# Patient Record
Sex: Male | Born: 1955 | Race: White | Hispanic: No | Marital: Married | Smoking: Never smoker
Health system: Southern US, Community
[De-identification: ages and names within clinical notes are randomized; demographics above are authoritative.]

## PROBLEM LIST (undated history)

## (undated) DIAGNOSIS — M109 Gout, unspecified: Secondary | ICD-10-CM

## (undated) DIAGNOSIS — C801 Malignant (primary) neoplasm, unspecified: Secondary | ICD-10-CM

## (undated) DIAGNOSIS — D039 Melanoma in situ, unspecified: Secondary | ICD-10-CM

## (undated) HISTORY — PX: OTHER SURGICAL HISTORY: SHX169

## (undated) HISTORY — PX: KNEE SURGERY: SHX244

---
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
## 2017-07-02 DIAGNOSIS — D039 Melanoma in situ, unspecified: Secondary | ICD-10-CM

## 2017-07-02 HISTORY — DX: Melanoma in situ, unspecified: D03.9

## 2018-12-14 ENCOUNTER — Other Ambulatory Visit: Payer: Self-pay

## 2018-12-14 ENCOUNTER — Emergency Department (HOSPITAL_BASED_OUTPATIENT_CLINIC_OR_DEPARTMENT_OTHER)
Admission: EM | Admit: 2018-12-14 | Discharge: 2018-12-14 | Disposition: A | Discharge status: Home / Self Care | Payer: 59 | Attending: Emergency Medicine | Admitting: Emergency Medicine

## 2018-12-14 ENCOUNTER — Encounter (HOSPITAL_BASED_OUTPATIENT_CLINIC_OR_DEPARTMENT_OTHER): Payer: Self-pay | Admitting: Emergency Medicine

## 2018-12-14 ENCOUNTER — Emergency Department (HOSPITAL_BASED_OUTPATIENT_CLINIC_OR_DEPARTMENT_OTHER): Payer: 59

## 2018-12-14 DIAGNOSIS — Y929 Unspecified place or not applicable: Secondary | ICD-10-CM | POA: Insufficient documentation

## 2018-12-14 DIAGNOSIS — Y999 Unspecified external cause status: Secondary | ICD-10-CM | POA: Insufficient documentation

## 2018-12-14 DIAGNOSIS — W450XXA Nail entering through skin, initial encounter: Secondary | ICD-10-CM | POA: Diagnosis not present

## 2018-12-14 DIAGNOSIS — Z79899 Other long term (current) drug therapy: Secondary | ICD-10-CM | POA: Diagnosis not present

## 2018-12-14 DIAGNOSIS — Y9301 Activity, walking, marching and hiking: Secondary | ICD-10-CM | POA: Diagnosis not present

## 2018-12-14 DIAGNOSIS — Z23 Encounter for immunization: Secondary | ICD-10-CM | POA: Insufficient documentation

## 2018-12-14 DIAGNOSIS — S91331A Puncture wound without foreign body, right foot, initial encounter: Secondary | ICD-10-CM | POA: Diagnosis present

## 2018-12-14 HISTORY — DX: Malignant (primary) neoplasm, unspecified: C80.1

## 2018-12-14 HISTORY — DX: Gout, unspecified: M10.9

## 2018-12-14 MED ORDER — TETANUS-DIPHTH-ACELL PERTUSSIS 5-2.5-18.5 LF-MCG/0.5 IM SUSP
0.5000 mL | Freq: Once | INTRAMUSCULAR | Status: AC
  Administered 2018-12-14: 0.5 mL via INTRAMUSCULAR
  Filled 2018-12-14: qty 0.5

## 2018-12-14 MED ORDER — CIPROFLOXACIN HCL 500 MG PO TABS: 500.0000 mg | ORAL_TABLET | Freq: Two times a day (BID) | ORAL | 0 refills | Status: AC

## 2018-12-14 NOTE — ED Provider Notes (Signed)
[facility] EMERGENCY DEPARTMENT Provider Note   CSN: [id number] Arrival date & time: 12/14/18  1629     History   Chief Complaint Chief Complaint  Patient presents with  . Puncture Wound    HPI Austin Powell. is a 63 y.o. male who presents for evaluation of puncture wound to right foot.  He reports that about an hour prior to ED arrival, he was working on the woods and he states that he stepped on a board that had a rusty needle on it.  He states that he was wearing [employer]land boots at the time but states that the needle went straight through the sole of the boot.  He states he has been able to walk and move the foot since then.  He states he does not know when his last tetanus shot was.  He denies any numbness/weakness.  The history is provided by the patient.    Past Medical History:  Diagnosis Date  . Cancer ([place])    skin cancer removal  . Gout     There are no active problems to display for this patient.   Past Surgical History:  Procedure Laterality Date  . gout    . KNEE SURGERY          Home Medications    Prior to Admission medications   Medication Sig Start Date End Date Taking? Authorizing Provider  allopurinol (ZYLOPRIM) 100 MG tablet Take 100 mg by mouth daily.   Yes [provider]  ciprofloxacin (CIPRO) 500 MG tablet Take 1 tablet (500 mg total) by mouth every 12 (twelve) hours for 7 days. 12/14/18 12/21/18  Volanda Napoleon, PA-C    Family History History reviewed. No pertinent family history.  Social History Social History   Tobacco Use  . Smoking status: Never Smoker  . Smokeless tobacco: Never Used  Substance Use Topics  . Alcohol use: Yes  . Drug use: Never     Allergies   Latex   Review of Systems Review of Systems  Skin: Positive for wound.  Neurological: Negative for weakness and numbness.  All other systems reviewed and are negative.    Physical Exam Updated Vital Signs BP (!) 144/100 (BP  Location: Right Arm)   Pulse (!) 52   Temp 97.7 F (36.5 C) (Oral)   Resp 18   Ht 5\' 10"  (1.778 m)   Wt 83.9 kg   SpO2 98%   BMI 26.54 kg/m   Physical Exam Vitals signs and nursing note reviewed.  Constitutional:      Appearance: He is well-developed.  HENT:     Head: Normocephalic and atraumatic.  Eyes:     General: No scleral icterus.       Right eye: No discharge.        Left eye: No discharge.     Conjunctiva/sclera: Conjunctivae normal.  Cardiovascular:     Pulses:          Dorsalis pedis pulses are 2+ on the right side and 2+ on the left side.  Pulmonary:     Effort: Pulmonary effort is normal.  Skin:    General: Skin is warm and dry.     Capillary Refill: Capillary refill takes less than 2 seconds.     Comments: Small 0.25 cm wound noted to the plantar surface of the foot. Good distal cap refill.  RLE is not dusky in appearance or cool to touch.  Neurological:     Mental Status: He is  alert.  Psychiatric:        Speech: Speech normal.        Behavior: Behavior normal.      ED Treatments / Results  Labs (all labs ordered are listed, but only abnormal results are displayed) Labs Reviewed - No data to display  EKG None  Radiology Dg Foot Complete Right  Result Date: 12/14/2018 CLINICAL DATA:  Stepped on a screw 1 hour ago, puncture wound to plantar aspect of RIGHT foot EXAM: RIGHT FOOT COMPLETE - 3+ VIEW COMPARISON:  None FINDINGS: Osseous mineralization normal. Joint spaces preserved. Site of puncture not specified, question at midfoot on lateral view where minimal plantar soft tissue swelling is identified at the level of the TMT joints. No acute fracture, dislocation, or bone destruction. No soft tissue gas or radiopaque foreign bodies. IMPRESSION: No acute osseous abnormalities or radiopaque foreign bodies. Electronically Signed   By: Lavonia Dana M.D.   On: 12/14/2018 17:18    Procedures Procedures (including critical care time)  Medications Ordered in  ED Medications  Tdap (BOOSTRIX) injection 0.5 mL (0.5 mLs Intramuscular Given 12/14/18 1652)     Initial Impression / Assessment and Plan / ED Course  I have reviewed the triage vital signs and the nursing notes.  Pertinent labs & imaging results that were available during my care of the patient were reviewed by me and considered in my medical decision making (see chart for details).     63 year old male who presents for evaluation of puncture wound right foot.  Does not know when his last tetanus shot was. Patient is afebrile, non-toxic appearing, sitting comfortably on examination table. Vital signs reviewed and stable. Patient is neurovascularly intact.  Plan for wound care, update tetanus, x-ray.  X-ray negative for any acute bony abnormalities.  Discussed results with patient.  Encouraged at home supportive care.  Given that it was through his shoe and a nail injury, will plan to put him on antibiotics for Pseudomonas coverage.  Patient with no known drug allergies. At this time, patient exhibits no emergent life-threatening condition that require further evaluation in ED or admission. Patient had ample opportunity for questions and discussion. All patient's questions were answered with full understanding. Strict return precautions discussed. Patient expresses understanding and agreement to plan.   Portions of this note were generated with Lobby[employer]. Dictation errors may occur despite best attempts at proofreading.   Final Clinical Impressions(s) / ED Diagnoses   Final diagnoses:  Puncture wound of right foot, initial encounter    ED Discharge Orders         Ordered    ciprofloxacin (CIPRO) 500 MG tablet  Every 12 hours     12/14/18 1742           Volanda Napoleon, PA-C 12/14/18 [place], [place], DO 12/14/18 Bosie Helper

## 2018-12-14 NOTE — Discharge Instructions (Signed)
Keep the wound clean and dry and gently clean the wound with soap and water. Make sure to pat dry the wound before covering it with any dressing. You can use topical antibiotic ointment and bandage. Ice and elevate for pain relief.   You can take Tylenol or Ibuprofen as directed for pain. You can alternate Tylenol and Ibuprofen every 4 hours for additional pain relief.   Take antibiotics as directed. Please take all of your antibiotics until finished.  Monitor closely for any signs of infection. Return to the Emergency Department for any worsening redness/swelling of the area that begins to spread, drainage from the site, worsening pain, fever or any other worsening or concerning symptoms.

## 2018-12-14 NOTE — ED Triage Notes (Signed)
Patient states that he stepped on a rusty screw about an hour ago to the right foot.

## 2018-12-31 ENCOUNTER — Other Ambulatory Visit: Payer: Self-pay | Admitting: Gastroenterology

## 2018-12-31 DIAGNOSIS — K921 Melena: Secondary | ICD-10-CM

## 2018-12-31 DIAGNOSIS — R140 Abdominal distension (gaseous): Secondary | ICD-10-CM

## 2018-12-31 DIAGNOSIS — R109 Unspecified abdominal pain: Secondary | ICD-10-CM

## 2019-01-13 ENCOUNTER — Ambulatory Visit
Admission: RE | Admit: 2019-01-13 | Discharge: 2019-01-13 | Disposition: A | Discharge status: Home / Self Care | Payer: 59 | Source: Ambulatory Visit | Attending: Gastroenterology | Admitting: Gastroenterology

## 2019-01-13 DIAGNOSIS — R109 Unspecified abdominal pain: Secondary | ICD-10-CM

## 2019-01-13 DIAGNOSIS — R140 Abdominal distension (gaseous): Secondary | ICD-10-CM

## 2019-01-13 DIAGNOSIS — K921 Melena: Secondary | ICD-10-CM

## 2019-01-13 MED ORDER — IOPAMIDOL (ISOVUE-300) INJECTION 61%
100.0000 mL | Freq: Once | INTRAVENOUS | Status: AC | PRN
  Administered 2019-01-13: 100 mL via INTRAVENOUS

## 2019-05-22 ENCOUNTER — Encounter: Payer: Self-pay | Admitting: *Deleted

## 2020-07-05 ENCOUNTER — Other Ambulatory Visit: Payer: Self-pay

## 2020-07-05 ENCOUNTER — Ambulatory Visit (INDEPENDENT_AMBULATORY_CARE_PROVIDER_SITE_OTHER): Payer: 59 | Admitting: Physician Assistant

## 2020-07-05 ENCOUNTER — Encounter: Payer: Self-pay | Admitting: Physician Assistant

## 2020-07-05 DIAGNOSIS — L219 Seborrheic dermatitis, unspecified: Secondary | ICD-10-CM | POA: Diagnosis not present

## 2020-07-05 DIAGNOSIS — L570 Actinic keratosis: Secondary | ICD-10-CM | POA: Diagnosis not present

## 2020-07-05 DIAGNOSIS — Z86006 Personal history of melanoma in-situ: Secondary | ICD-10-CM | POA: Diagnosis not present

## 2020-07-05 MED ORDER — CICLOPIROX OLAMINE 0.77 % EX CREA: TOPICAL_CREAM | Freq: Two times a day (BID) | CUTANEOUS | 2 refills | Status: DC

## 2020-07-05 MED ORDER — HYDROCORTISONE 2.5 % EX CREA: TOPICAL_CREAM | Freq: Two times a day (BID) | CUTANEOUS | 3 refills | Status: AC | PRN

## 2020-07-05 MED ORDER — CICLOPIROX 1 % EX SHAM: 1.0000 "application " | MEDICATED_SHAMPOO | Freq: Every day | CUTANEOUS | 6 refills | Status: DC

## 2020-07-05 NOTE — Progress Notes (Signed)
   Follow up Visit  Subjective  Austin Powell. is a 64 y.o. male who presents for the following: Follow-up (Needs medication refill cilopiroc shampoo and cream and Hydrocortisone 2.5% cream for patients face. ). His seb derm comes and goes and sometimes gets worse when he is hot. The rash on his legs has gotten better since he has been covering his legs when he mows.  Objective  Well appearing patient in no apparent distress; mood and affect are within normal limits.  All skin waist up examined. No suspicious moles noted on back.   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   Objective  Left Malar Cheek, Left Preauricular Area (3), Left Temporal Scalp, Mid Parietal Scalp, Right Antihelix, Right Forehead (4), Right Malar Cheek, Right Temporal Scalp (2): Erythematous patches with gritty scale.  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  Seborrheic dermatitis (2) Nose; Lips  Ordered Medications: ciclopirox (LOPROX) 0.77 % cream Ciclopirox 1 % shampoo hydrocortisone 2.5 % cream  AK (actinic keratosis) (14) Right Antihelix; Mid Parietal Scalp; Left Preauricular Area (3); Left Malar Cheek; Right Malar Cheek; Right Forehead (4); Left Temporal Scalp; Right Temporal Scalp (2)  Destruction of lesion - Left Malar Cheek, Left Preauricular Area, Left Temporal Scalp, Mid Parietal Scalp, Right Antihelix, Right Forehead, Right Malar Cheek, Right Temporal Scalp Complexity: simple   Destruction method: cryotherapy   Informed consent: discussed and consent obtained   Timeout:  patient name, date of birth, surgical site, and procedure verified Lesion destroyed using liquid nitrogen: Yes   Outcome: patient tolerated procedure well with no complications

## 2020-11-21 ENCOUNTER — Emergency Department (HOSPITAL_COMMUNITY)
Admission: EM | Admit: 2020-11-21 | Discharge: 2020-11-21 | Disposition: A | Discharge status: Home / Self Care | Payer: 59 | Attending: Emergency Medicine | Admitting: Emergency Medicine

## 2020-11-21 ENCOUNTER — Encounter (HOSPITAL_COMMUNITY): Payer: Self-pay | Admitting: Emergency Medicine

## 2020-11-21 ENCOUNTER — Emergency Department (HOSPITAL_COMMUNITY): Payer: 59

## 2020-11-21 ENCOUNTER — Other Ambulatory Visit: Payer: Self-pay

## 2020-11-21 DIAGNOSIS — R030 Elevated blood-pressure reading, without diagnosis of hypertension: Secondary | ICD-10-CM | POA: Diagnosis not present

## 2020-11-21 DIAGNOSIS — R0789 Other chest pain: Secondary | ICD-10-CM | POA: Insufficient documentation

## 2020-11-21 DIAGNOSIS — Z85828 Personal history of other malignant neoplasm of skin: Secondary | ICD-10-CM | POA: Diagnosis not present

## 2020-11-21 DIAGNOSIS — Z91040 Latex allergy status: Secondary | ICD-10-CM | POA: Diagnosis not present

## 2020-11-21 DIAGNOSIS — R079 Chest pain, unspecified: Secondary | ICD-10-CM | POA: Diagnosis present

## 2020-11-21 LAB — CBC
HCT: 45.8 % (ref 39.0–52.0)
Hemoglobin: 15 g/dL (ref 13.0–17.0)
MCH: 32.3 pg (ref 26.0–34.0)
MCHC: 32.8 g/dL (ref 30.0–36.0)
MCV: 98.7 fL (ref 80.0–100.0)
Platelets: 195 10*3/uL (ref 150–400)
RBC: 4.64 MIL/uL (ref 4.22–5.81)
RDW: 12.5 % (ref 11.5–15.5)
WBC: 7.7 10*3/uL (ref 4.0–10.5)
nRBC: 0 % (ref 0.0–0.2)

## 2020-11-21 LAB — BASIC METABOLIC PANEL
Anion gap: 9 (ref 5–15)
BUN: 17 mg/dL (ref 8–23)
CO2: 27 mmol/L (ref 22–32)
Calcium: 9.3 mg/dL (ref 8.9–10.3)
Chloride: 104 mmol/L (ref 98–111)
Creatinine, Ser: 1.18 mg/dL (ref 0.61–1.24)
GFR, Estimated: >60 mL/min (ref >60)
Glucose, Bld: 116 mg/dL — ABNORMAL HIGH (ref 70–99)
Potassium: 5.2 mmol/L — ABNORMAL HIGH (ref 3.5–5.1)
Sodium: 140 mmol/L (ref 135–145)

## 2020-11-21 LAB — TROPONIN I (HIGH SENSITIVITY)
Troponin I (High Sensitivity): 6 ng/L (ref <18)
Troponin I (High Sensitivity): 5 ng/L (ref <18)

## 2020-11-21 MED ORDER — LIDOCAINE VISCOUS HCL 2 % MT SOLN
15.0000 mL | Freq: Once | OROMUCOSAL | Status: AC
  Administered 2020-11-21: 15 mL via ORAL
  Filled 2020-11-21: qty 15

## 2020-11-21 MED ORDER — ALUM & MAG HYDROXIDE-SIMETH 200-200-20 MG/5ML PO SUSP
30.0000 mL | Freq: Once | ORAL | Status: AC
  Administered 2020-11-21: 30 mL via ORAL
  Filled 2020-11-21: qty 30

## 2020-11-21 MED ORDER — PANTOPRAZOLE SODIUM 20 MG PO TBEC: 20.0000 mg | DELAYED_RELEASE_TABLET | Freq: Every day | ORAL | 0 refills | Status: DC

## 2020-11-21 NOTE — ED Triage Notes (Signed)
Intermittent sharp chest pain and increased HR since waking up this morning.  States he feels SOB since arriving to hospital but believes it is because he is nervous.  Denies nausea and vomiting.

## 2020-11-21 NOTE — ED Notes (Signed)
Discharged

## 2020-11-21 NOTE — Discharge Instructions (Addendum)
Please avoid any alcohol or spicy foods as well as ibuprofen and Motrin as this can cause reflux type symptoms.  Take Protonix as prescribed.  Follow-up with your primary care doctor and return to the ED if symptoms worsen.

## 2020-11-21 NOTE — ED Provider Notes (Signed)
[facility] EMERGENCY DEPARTMENT Provider Note   CSN: [id number] Arrival date & time: 11/21/20  1937     History Chief Complaint  Patient presents with  . Chest Pain    Austin Powell. is a 65 y.o. male.  The history is provided by the patient.  Chest Pain Pain location:  L chest Pain quality: not aching   Pain radiates to:  Does not radiate Pain severity:  Mild Onset quality:  Gradual Chronicity:  New Relieved by:  Nothing Worsened by:  Nothing Associated symptoms: no abdominal pain, no back pain, no cough, no fever, no palpitations, no shortness of breath and no vomiting   Risk factors: high cholesterol   Risk factors: no coronary artery disease, no diabetes mellitus, no hypertension and no prior DVT/PE        Past Medical History:  Diagnosis Date  . Cancer ([place])    skin cancer removal  . Gout   . 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   Past Surgical History:  Procedure Laterality Date  . gout    . KNEE SURGERY         No family history on file.  Social History   Tobacco Use  . Smoking status: Never Smoker  . Smokeless tobacco: Never Used  Substance Use Topics  . Alcohol use: Yes  . Drug use: Never    Home Medications Prior to Admission medications   Medication Sig Start Date End Date Taking? Authorizing Provider  pantoprazole (PROTONIX) 20 MG tablet Take 1 tablet (20 mg total) by mouth daily for 14 days. 11/21/20 12/05/20 Yes Amour Cutrone, DO  allopurinol (ZYLOPRIM) 100 MG tablet Take 100 mg by mouth daily.    [provider]  aspirin 81 MG EC tablet Take by mouth.    [provider]  ciclopirox (LOPROX) 0.77 % cream Apply topically 2 (two) times daily. 07/05/20   [place], [place], PA-C  Ciclopirox 1 % shampoo Apply 1 application topically daily. 07/05/20   Clark-Burning, Anderson Malta, PA-C  hydrocortisone 2.5 % cream Apply  topically 2 (two) times daily as needed (Rash). 07/05/20   [place], Anderson Malta, PA-C    Allergies    Latex  Review of Systems   Review of Systems  Constitutional: Negative for chills and fever.  HENT: Negative for ear pain and sore throat.   Eyes: Negative for pain and visual disturbance.  Respiratory: Negative for cough and shortness of breath.   Cardiovascular: Positive for chest pain. Negative for palpitations.  Gastrointestinal: Negative for abdominal pain and vomiting.  Genitourinary: Negative for dysuria and hematuria.  Musculoskeletal: Negative for arthralgias and back pain.  Skin: Negative for color change and rash.  Neurological: Negative for seizures and syncope.  All other systems reviewed and are negative.   Physical Exam Updated Vital Signs  ED Triage Vitals  Enc Vitals Group     BP 11/21/20 1001 (!) 152/79     Pulse Rate 11/21/20 1001 (!) 47     Resp 11/21/20 1001 17     Temp 11/21/20 1001 98.3 F (36.8 C)     Temp Source 11/21/20 1001 Oral     SpO2 11/21/20 1001 100 %     Weight 11/21/20 1001 188 lb (85.3 kg)     Height 11/21/20 1001 5\' 10"  (1.778 m)     Head Circumference --      Peak Flow --  Pain Score 11/21/20 0958 3     Pain Loc --      Pain Edu? --      Excl. in [place]? --     Physical Exam Vitals and nursing note reviewed.  Constitutional:      General: He is not in acute distress.    Appearance: He is well-developed and well-nourished. He is not ill-appearing.  HENT:     Head: Normocephalic and atraumatic.  Eyes:     Extraocular Movements: Extraocular movements intact.     Conjunctiva/sclera: Conjunctivae normal.     Pupils: Pupils are equal, round, and reactive to light.  Cardiovascular:     Rate and Rhythm: Normal rate and regular rhythm.     Pulses:          Radial pulses are 2+ on the right side and 2+ on the left side.     Heart sounds: No murmur heard.   Pulmonary:     Effort: Pulmonary effort is normal. No respiratory  distress.     Breath sounds: Normal breath sounds. No decreased breath sounds.  Abdominal:     Palpations: Abdomen is soft.     Tenderness: There is no abdominal tenderness.  Musculoskeletal:        General: No edema. Normal range of motion.     Cervical back: Normal range of motion and neck supple.     Right lower leg: No edema.     Left lower leg: No edema.  Skin:    General: Skin is warm and dry.     Capillary Refill: Capillary refill takes less than 2 seconds.  Neurological:     General: No focal deficit present.     Mental Status: He is alert.  Psychiatric:        Mood and Affect: Mood and affect normal.     ED Results / Procedures / Treatments   Labs (all labs ordered are listed, but only abnormal results are displayed) Labs Reviewed  BASIC METABOLIC PANEL - Abnormal; Notable for the following components:      Result Value   Potassium 5.2 (*)    Glucose, Bld 116 (*)    All other components within normal limits  CBC  TROPONIN I (HIGH SENSITIVITY)  TROPONIN I (HIGH SENSITIVITY)    EKG EKG Interpretation  Date/Time:  Sunday November 21 2020 09:59:36 EST Ventricular Rate:  49 PR Interval:  146 QRS Duration: 94 QT Interval:  464 QTC Calculation: 419 R Axis:   71 Text Interpretation: Sinus bradycardia with sinus arrhythmia Otherwise normal ECG Confirmed by Lennice Sites [phone]) on 11/21/2020 11:08:16 AM   Radiology DG Chest 2 View  Result Date: 11/21/2020 CLINICAL DATA:  Chest pain, shortness of breath. EXAM: CHEST - 2 VIEW COMPARISON:  None. FINDINGS: The heart size and mediastinal contours are within normal limits. Both lungs are clear. No pneumothorax or pleural effusion is noted. The visualized skeletal structures are unremarkable. IMPRESSION: No active cardiopulmonary disease. Electronically Signed   By: Marijo Conception M.D.   On: 11/21/2020 10:13    Procedures Procedures (including critical care time)  Medications Ordered in ED Medications  alum & mag  hydroxide-simeth (MAALOX/MYLANTA) 200-200-20 MG/5ML suspension 30 mL (30 mLs Oral Given 11/21/20 1133)    And  lidocaine (XYLOCAINE) 2 % viscous mouth solution 15 mL (15 mLs Oral Given 11/21/20 1133)    ED Course  I have reviewed the triage vital signs and the nursing notes.  Pertinent labs & imaging results  that were available during my care of the patient were reviewed by me and considered in my medical decision making (see chart for details).    MDM Rules/Calculators/A&P                          Austin Powell. is a 65 year old male with history of high cholesterol who presents to the ED with chest pain.  Normal vitals.  No fever.  EKG shows sinus bradycardia with no ischemic changes.  Atypical sounding chest pain.  Chest x-ray showed no pneumothorax, no pneumonia.  Patient with no PE risk factors and doubt PE.  Heart score is 2.  First troponin already drawn is normal.  Possibly GI related pain as pain this morning after drinking some alcohol last night.  Does not have any abdominal tenderness on exam.  Overall appears comfortable.  Clear breath sounds.  Exam is overall unremarkable.  Patient works out daily and does not have chest pain when he works out.  Will get repeat troponin and give GI cocktail anticipate discharge with follow-up with primary care doctor.  Repeat troponin is unremarkable.  Feeling better after GI cocktail.  Suspect possibly gastritis or reflux.  Will prescribe Protonix and have him follow-up with primary care doctor.  This chart was dictated using voice recognition software.  Despite best efforts to proofread,  errors can occur which can change the documentation meaning.    Final Clinical Impression(s) / ED Diagnoses Final diagnoses:  Atypical chest pain    Rx / DC Orders ED Discharge Orders         Ordered    pantoprazole (PROTONIX) 20 MG tablet  Daily        11/21/20 [place], [place], DO 11/21/20 1341

## 2021-07-06 ENCOUNTER — Ambulatory Visit: Payer: 59 | Admitting: Physician Assistant

## 2021-12-07 IMAGING — CR DG CHEST 2V
2 series · 2 of 2 positions shown · non-contrast
Comparison: None.

CLINICAL DATA: Chest pain, shortness of breath.

EXAM:
CHEST - 2 VIEW

[chest pa]
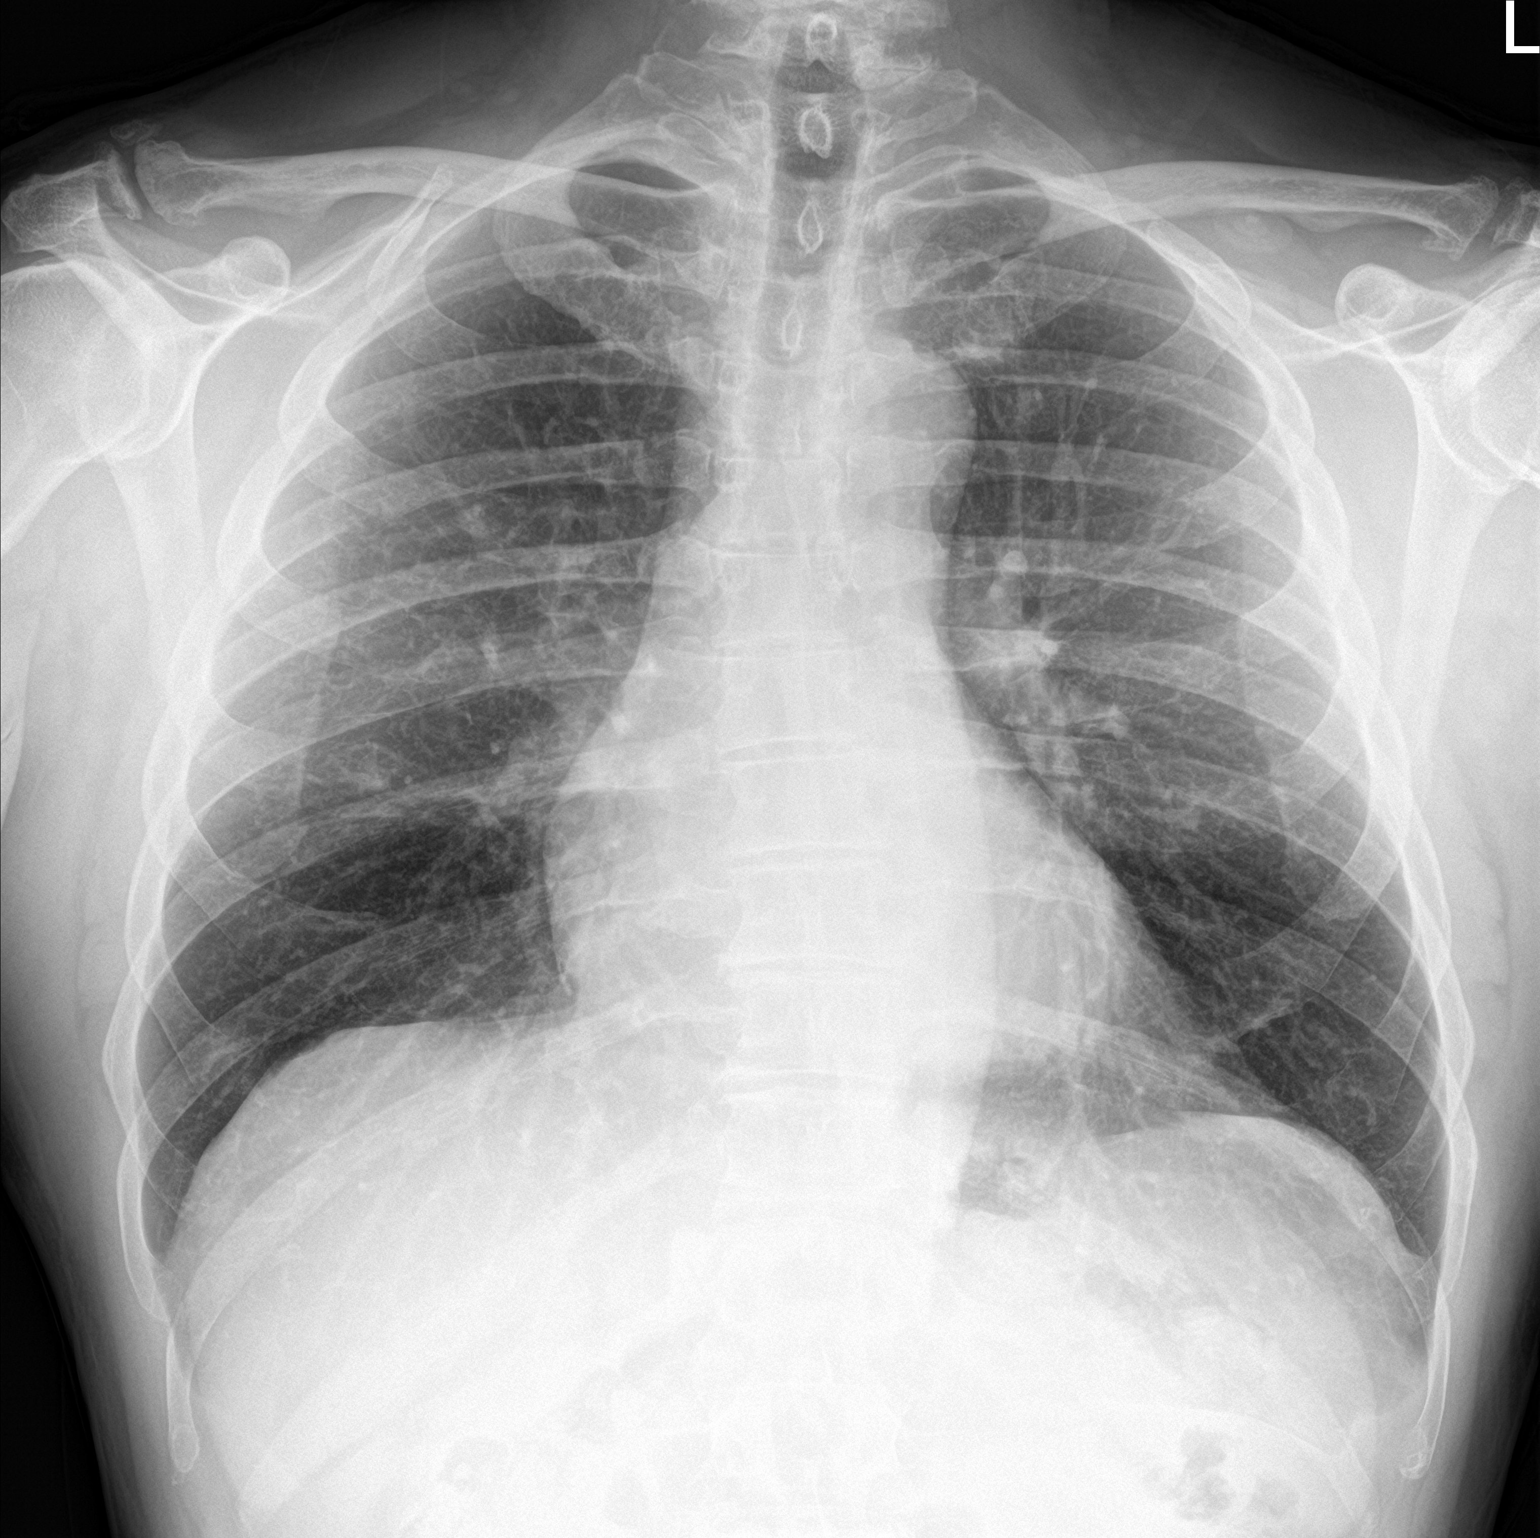

[chest lat]
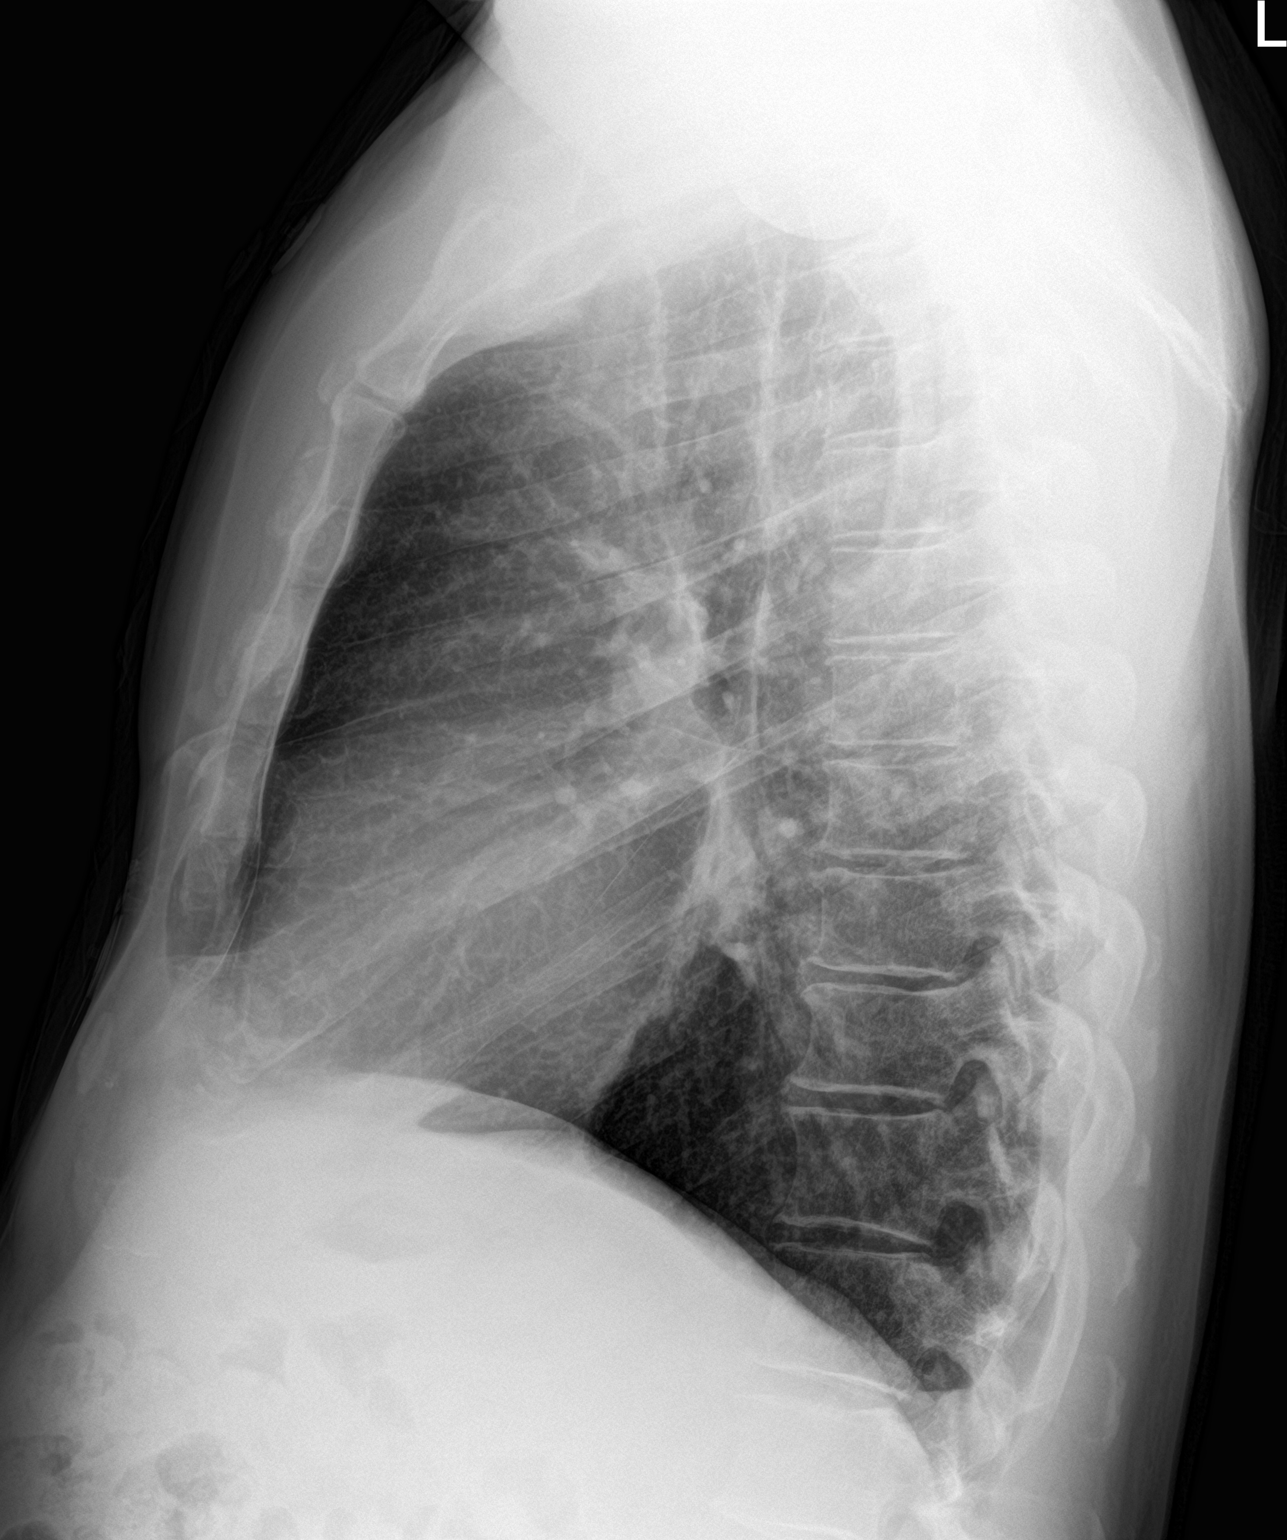

[2 of 2 positions shown; findings below may reference images not displayed]

FINDINGS: The heart size and mediastinal contours are within normal limits.
Both lungs are clear. No pneumothorax or pleural effusion is noted.
The visualized skeletal structures are unremarkable.
IMPRESSION: No active cardiopulmonary disease.

## 2022-10-13 ENCOUNTER — Ambulatory Visit (INDEPENDENT_AMBULATORY_CARE_PROVIDER_SITE_OTHER): Payer: 59 | Admitting: Diagnostic Neuroimaging

## 2022-10-13 ENCOUNTER — Encounter: Payer: Self-pay | Admitting: Diagnostic Neuroimaging

## 2022-10-13 VITALS — BP 139/85 | HR 43 | Ht 70.5 in | Wt 182.0 lb

## 2022-10-13 DIAGNOSIS — R200 Anesthesia of skin: Secondary | ICD-10-CM

## 2022-10-13 NOTE — Progress Notes (Signed)
[facility]  PATIENT: Austin Powell. DOB: April 29, 1956  REFERRING CLINICIAN: Kathlene November., D* HISTORY FROM: patient  REASON FOR VISIT: new consult    HISTORICAL  CHIEF COMPLAINT:  Chief Complaint  Patient presents with   New Patient (Initial Visit)    Rm 7 alone. Reports he is here to discuss worsening facial pain/numbness on the the left side of the face. Sx have been present since July. He also reports numbness/pain around his tailbone, these symptoms started August.    HISTORY OF PRESENT ILLNESS:   66 year old male here for evaluation of abnormal sensations.  July 2023 had discomfort in his left scapular region radiating to his left shoulder left neck and left face.  Has some numbness and abnormal sensations.  In August 2023 noticed some abnormal sensations in his tailbone and perianal region.  He describes a swollen and numb sensation.  No bowel or bladder dysfunction.  No problems with feet or legs.  No problems with arms, speech, vision or swallowing.  No headaches.   REVIEW OF SYSTEMS: Full 14 system review of systems performed and negative with exception of: as per hPI.  ALLERGIES: Allergies  Allergen Reactions   Latex Rash    HOME MEDICATIONS: Outpatient Medications Prior to Visit  Medication Sig Dispense Refill   allopurinol (ZYLOPRIM) 100 MG tablet Take 100 mg by mouth daily.     aspirin 81 MG EC tablet Take by mouth.     hydrocortisone 2.5 % cream Apply topically 2 (two) times daily as needed (Rash). 30 g 3   testosterone cypionate (DEPOTESTOSTERONE CYPIONATE) 200 MG/ML injection SMARTSIG:0.5 Milliliter(s) IM Once a Week     ciclopirox (LOPROX) 0.77 % cream Apply topically 2 (two) times daily. (Patient not taking: Reported on 10/13/2022) 90 g 2   Ciclopirox 1 % shampoo Apply 1 application topically daily. (Patient not taking: Reported on 10/13/2022) 120 mL 6   pantoprazole (PROTONIX) 20 MG tablet Take 1 tablet (20 mg total) by mouth  daily for 14 days. 14 tablet 0   No facility-administered medications prior to visit.    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   left neck-mohs    PAST SURGICAL HISTORY: Past Surgical History:  Procedure Laterality Date   gout     KNEE SURGERY      FAMILY HISTORY: Family History  Problem Relation Age of Onset   Lung cancer Mother    Stroke Father     SOCIAL HISTORY: Social History   Socioeconomic History   Marital status: Married    Spouse name: Not on file   Number of children: Not on file   Years of education: Not on file   Highest education level: Not on file  Occupational History   Not on file  Tobacco Use   Smoking status: Never   Smokeless tobacco: Never  Substance and Sexual Activity   Alcohol use: Yes   Drug use: Never   Sexual activity: Not on file  Other Topics Concern   Not on file  Social History Narrative   Right handed    Caffeine- 1 cup per day    Social Determinants of Health   Financial Resource Strain: Not on file  Food Insecurity: Not on file  Transportation Needs: Not on file  Physical Activity: Not on file  Stress: Not on file  Social Connections: Not on file  Intimate Partner Violence: Not on file     PHYSICAL EXAM  GENERAL EXAM/CONSTITUTIONAL: Vitals:  Vitals:   10/13/22 0840  BP: 139/85  Pulse: (!) 43  Weight: 182 lb (82.6 kg)  Height: 5' 10.5" (1.791 m)   Body mass index is 25.75 kg/m. Wt Readings from Last 3 Encounters:  10/13/22 182 lb (82.6 kg)  11/21/20 188 lb (85.3 kg)  12/14/18 185 lb (83.9 kg)   Patient is in no distress; well developed, nourished and groomed; neck is supple  CARDIOVASCULAR: Examination of carotid arteries is normal; no carotid bruits Regular rate and rhythm, no murmurs Examination of peripheral vascular system by observation and palpation is  normal  EYES: Ophthalmoscopic exam of optic discs and posterior segments is normal; no papilledema or hemorrhages No results found.  MUSCULOSKELETAL: Gait, strength, tone, movements noted in Neurologic exam below  NEUROLOGIC: MENTAL STATUS:      No data to display         awake, alert, oriented to person, place and time recent and remote memory intact normal attention and concentration language fluent, comprehension intact, naming intact fund of knowledge appropriate  CRANIAL NERVE:  2nd - no papilledema on fundoscopic exam 2nd, 3rd, 4th, 6th - pupils equal and reactive to light, visual fields full to confrontation, extraocular muscles intact, no nystagmus 5th - facial sensation symmetric 7th - facial strength symmetric 8th - hearing intact 9th - palate elevates symmetrically, uvula midline 11th - shoulder shrug symmetric 12th - tongue protrusion midline  MOTOR:  normal bulk and tone, full strength in the BUE, BLE  SENSORY:  normal and symmetric to light touch, temperature, vibration  COORDINATION:  finger-nose-finger, fine finger movements normal  REFLEXES:  deep tendon reflexes 1+ and symmetric  GAIT/STATION:  narrow based gait     DIAGNOSTIC DATA (LABS, IMAGING, TESTING) - I reviewed patient records, labs, notes, testing and imaging myself where available.  Lab Results  Component Value Date   WBC 7.7 11/21/2020   HGB 15.0 11/21/2020   HCT 45.8 11/21/2020   MCV 98.7 11/21/2020   PLT 195 11/21/2020      Component Value Date/Time   NA 140 11/21/2020 1011   K 5.2 (H) 11/21/2020 1011   CL 104 11/21/2020 1011   CO2 27 11/21/2020 1011   GLUCOSE 116 (H) 11/21/2020 1011   BUN 17 11/21/2020 1011   CREATININE 1.18 11/21/2020 1011   CALCIUM 9.3 11/21/2020 1011   GFRNONAA >60 11/21/2020 1011   No results found for: "CHOL", "HDL", "LDLCALC", "LDLDIRECT", "TRIG", "CHOLHDL" No results found for: "HGBA1C" No results found for: "VITAMINB12" No results  found for: "TSH"     ASSESSMENT AND PLAN  66 y.o. year old male here with:   Dx:  1. Left facial numbness   2. Saddle anesthesia       PLAN:  ABNORMAL SENSATIONS (left face, neck, shoulder; peri-anal) - check labs and MRI brain, cervical spine (rule out demyelinating disease, autoimmune, inflamm, mass, vascular)  Orders Placed This Encounter  Procedures   MR BRAIN W WO CONTRAST   MR CERVICAL SPINE W WO CONTRAST   Vitamin B12   Copper, serum   Ceruloplasmin   Vitamin B6   Return for pending test results, pending if symptoms worsen or fail to improve.    Penni Bombard, MD 64/02/346, 4:25 AM Certified in Neurology, Neurophysiology and Neuroimaging  [facility] [address] [place], [place] [address] [phone]

## 2022-10-16 ENCOUNTER — Telehealth: Payer: Self-pay | Admitting: Diagnostic Neuroimaging

## 2022-10-16 NOTE — Telephone Encounter (Signed)
See phone note

## 2022-10-16 NOTE — Telephone Encounter (Signed)
Pt is asking for a return call to schedule MRI

## 2022-10-16 NOTE — Telephone Encounter (Signed)
Pt rescheduled for 10/18/22 at 11:00am

## 2022-10-16 NOTE — Telephone Encounter (Signed)
Patient called to see if he can reschedule his MRI appointment stated he has another doctor appointment at the same time. His best call back number is priority in his chart [phone]

## 2022-10-16 NOTE — Telephone Encounter (Signed)
Pt scheduled for MR brain w/wo and MR cervical spine w/wo contrast at [facility] for 10/25/22 at [facility] auth# [id number] (exp.10/13/22-11/27/22)

## 2022-10-18 ENCOUNTER — Ambulatory Visit: Payer: 59

## 2022-10-18 DIAGNOSIS — R200 Anesthesia of skin: Secondary | ICD-10-CM

## 2022-10-18 MED ORDER — GADOBENATE DIMEGLUMINE 529 MG/ML IV SOLN
15.0000 mL | Freq: Once | INTRAVENOUS | Status: AC | PRN
  Administered 2022-10-18: 15 mL via INTRAVENOUS

## 2022-10-21 LAB — VITAMIN B12: Vitamin B-12: 524 pg/mL (ref 232–1245)

## 2022-10-21 LAB — CERULOPLASMIN: Ceruloplasmin: 19.3 mg/dL (ref 16.0–31.0)

## 2022-10-21 LAB — VITAMIN B6: Vitamin B6: 77.4 ug/L — ABNORMAL HIGH (ref 3.4–65.2)

## 2022-10-21 LAB — COPPER, SERUM: Copper: 86 ug/dL (ref 69–132)

## 2022-10-24 ENCOUNTER — Telehealth: Payer: Self-pay | Admitting: Diagnostic Neuroimaging

## 2022-10-24 NOTE — Telephone Encounter (Signed)
Please result  

## 2022-10-24 NOTE — Telephone Encounter (Signed)
Pt would like a call from the nurse to discuss bloodwork and MRI results.

## 2022-10-25 ENCOUNTER — Other Ambulatory Visit: Payer: 59

## 2022-10-31 ENCOUNTER — Telehealth: Payer: Self-pay

## 2022-10-31 DIAGNOSIS — R200 Anesthesia of skin: Secondary | ICD-10-CM

## 2022-10-31 NOTE — Telephone Encounter (Signed)
Called and Spoke to patient about the MRI and Lab results. Pt verbalized understanding. Patient is still concerned about Neck and Pelvic numbness and what next steps are. Informed him that I will reach out to Dr. Leta Baptist and get recommendations.

## 2022-11-10 ENCOUNTER — Other Ambulatory Visit: Payer: Self-pay | Admitting: Adult Health

## 2022-11-10 DIAGNOSIS — R972 Elevated prostate specific antigen [PSA]: Secondary | ICD-10-CM

## 2022-11-10 DIAGNOSIS — R102 Pelvic and perineal pain: Secondary | ICD-10-CM

## 2022-11-14 ENCOUNTER — Telehealth: Payer: Self-pay | Admitting: Diagnostic Neuroimaging

## 2022-11-14 NOTE — Addendum Note (Signed)
Addended by: Andrey Spearman R on: 11/14/2022 11:32 AM   Modules accepted: Orders

## 2022-11-14 NOTE — Telephone Encounter (Signed)
I already got auth for MRI lumbar w/wo  [employer] Josem Kaufmann: [id number] exp. 11/14/22-12/29/22 no specific location.

## 2022-11-14 NOTE — Telephone Encounter (Signed)
I got this message from [place]: Charlton Haws, Mr. Dugger has a mr Prostate wwo that he needs to schedule as well a possible  Mr Pelvis wwo that he may have to have done. He stated that when he was seen in your office that the reason the Dr. ordered the  Mr Dierdre Searles was due to the pelvic pain. He wants to know if [facility] ordered the Mr Prostate and Pelvis does he really need to get the Mr Charlett Lango that you all ordered ?  Please advise

## 2022-11-14 NOTE — Telephone Encounter (Signed)
I let GI know we will hold off on the MR lumbar spine and I will schedule it if/when the patient wants.

## 2022-11-14 NOTE — Telephone Encounter (Signed)
Pinched nerves in the neck may cause neck issues. Spinal cord ok, so no explanation for saddle anesthesia. Can consider PT eval or spine surgery / pain consult for pinched nerves in the neck.   Will check MRI lumbar spine w/wo for further saddle anesthesia workup.  Orders Placed This Encounter  Procedures   MR Lumbar Spine W Wo Contrast    Penni Bombard, MD 12/07/3084, 57:84 AM Certified in Neurology, Neurophysiology and Neuroimaging  [facility] [address] [place], [place] [address] [phone]

## 2022-11-14 NOTE — Telephone Encounter (Signed)
Called the patient back to let him know Dr Leta Baptist had reviewed his question and concern and patient asked could he call me back he was on another call.   **If pt calls back, please advise that Dr Leta Baptist reviewed his concerns discussed with Joellen Jersey, RN. He states pinched nerves in the neck may cause neck issues. He will order a MRI lumbar spine to assess further.  We can also get referrals placed for physical therapy for them to work with him or refer to neurosurgery to consult for the pinched nerves in the neck or both.  Please advise the patient of this and see if he would like for [place] to place referrals and if he is ok with MRI lumbar spine

## 2022-12-03 ENCOUNTER — Ambulatory Visit
Admission: RE | Admit: 2022-12-03 | Discharge: 2022-12-03 | Disposition: A | Discharge status: Home / Self Care | Payer: 59 | Source: Ambulatory Visit | Attending: Adult Health | Admitting: Adult Health

## 2022-12-03 DIAGNOSIS — R972 Elevated prostate specific antigen [PSA]: Secondary | ICD-10-CM

## 2022-12-03 MED ORDER — GADOPICLENOL 0.5 MMOL/ML IV SOLN
8.0000 mL | Freq: Once | INTRAVENOUS | Status: AC | PRN
  Administered 2022-12-03: 8 mL via INTRAVENOUS

## 2022-12-06 ENCOUNTER — Other Ambulatory Visit: Payer: 59

## 2023-01-04 NOTE — Telephone Encounter (Signed)
The patient called and asked for a new MR lumbar spine auth. I submitted it to [facility] and it is under medical review right now.

## 2023-01-08 ENCOUNTER — Telehealth: Payer: Self-pay | Admitting: Diagnostic Neuroimaging

## 2023-01-08 NOTE — Telephone Encounter (Signed)
Pt called stated his MRI was denied and he contacted his insurance and was told Dr. Leta Baptist need to called them to schedule a peer to peer before it can be approved. Pt is requesting a call back from nurse [employer] [phone] opt 3

## 2023-01-09 NOTE — Telephone Encounter (Signed)
Have you received an update on this yet?

## 2023-01-10 NOTE — Telephone Encounter (Signed)
MRI got denied, do you want to complete peer to peer

## 2023-01-10 NOTE — Telephone Encounter (Signed)
Noted
